# Patient Record
Sex: Female | Born: 1960 | Hispanic: Yes | Marital: Single | Smoking: Never smoker
Health system: Southern US, Community
[De-identification: ages and names within clinical notes are randomized; demographics above are authoritative.]

## PROBLEM LIST (undated history)

## (undated) DIAGNOSIS — I10 Essential (primary) hypertension: Secondary | ICD-10-CM

## (undated) DIAGNOSIS — E119 Type 2 diabetes mellitus without complications: Secondary | ICD-10-CM

## (undated) HISTORY — PX: CHOLECYSTECTOMY: SHX55

---

## 2012-06-09 ENCOUNTER — Inpatient Hospital Stay: Payer: Self-pay | Admitting: Internal Medicine

## 2012-06-09 LAB — BASIC METABOLIC PANEL
Anion Gap: 9 (ref 7–16)
BUN: 12 mg/dL (ref 7–18)
Calcium, Total: 9 mg/dL (ref 8.5–10.1)
Creatinine: 0.84 mg/dL (ref 0.60–1.30)
EGFR (Non-African Amer.): >60
Potassium: 3.2 mmol/L — ABNORMAL LOW (ref 3.5–5.1)
Sodium: 139 mmol/L (ref 136–145)

## 2012-06-09 LAB — HEPATIC FUNCTION PANEL A (ARMC)
Albumin: 4 g/dL (ref 3.4–5.0)
Alkaline Phosphatase: 191 U/L — ABNORMAL HIGH (ref 50–136)
SGOT(AST): 242 U/L — ABNORMAL HIGH (ref 15–37)
SGPT (ALT): 429 U/L — ABNORMAL HIGH (ref 12–78)
Total Protein: 7.5 g/dL (ref 6.4–8.2)

## 2012-06-09 LAB — URINALYSIS, COMPLETE
Bilirubin,UR: NEGATIVE
Glucose,UR: NEGATIVE mg/dL (ref 0–75)
Leukocyte Esterase: NEGATIVE
Nitrite: NEGATIVE
Protein: NEGATIVE
Specific Gravity: 1.014 (ref 1.003–1.030)
Squamous Epithelial: 2
WBC UR: 2 /HPF (ref 0–5)

## 2012-06-09 LAB — TROPONIN I: Troponin-I: <0.02 ng/mL

## 2012-06-09 LAB — CK TOTAL AND CKMB (NOT AT ARMC)
CK, Total: 70 U/L (ref 21–215)
CK-MB: <0.5 ng/mL — ABNORMAL LOW (ref 0.5–3.6)

## 2012-06-09 LAB — CBC
HGB: 13.7 g/dL (ref 12.0–16.0)
MCH: 29 pg (ref 26.0–34.0)
MCV: 86 fL (ref 80–100)
Platelet: 291 10*3/uL (ref 150–440)
RDW: 12.9 % (ref 11.5–14.5)

## 2012-06-09 LAB — LIPASE, BLOOD: Lipase: >3000 U/L (ref 73–393)

## 2012-06-10 LAB — CBC WITH DIFFERENTIAL/PLATELET
Eosinophil #: 0 10*3/uL (ref 0.0–0.7)
Lymphocyte #: 1.4 10*3/uL (ref 1.0–3.6)
MCHC: 33.5 g/dL (ref 32.0–36.0)
MCV: 86 fL (ref 80–100)
Monocyte #: 0.7 x10 3/mm (ref 0.2–0.9)
Monocyte %: 4.9 %
Neutrophil #: 13.2 10*3/uL — ABNORMAL HIGH (ref 1.4–6.5)
Neutrophil %: 86.1 %
Platelet: 255 10*3/uL (ref 150–440)
RDW: 12.9 % (ref 11.5–14.5)
WBC: 15.4 10*3/uL — ABNORMAL HIGH (ref 3.6–11.0)

## 2012-06-10 LAB — BASIC METABOLIC PANEL
Anion Gap: 7 (ref 7–16)
BUN: 20 mg/dL — ABNORMAL HIGH (ref 7–18)
Calcium, Total: 8.3 mg/dL — ABNORMAL LOW (ref 8.5–10.1)
Creatinine: 0.94 mg/dL (ref 0.60–1.30)
Glucose: 178 mg/dL — ABNORMAL HIGH (ref 65–99)
Osmolality: 285 (ref 275–301)
Potassium: 3.7 mmol/L (ref 3.5–5.1)

## 2012-06-10 LAB — HEPATIC FUNCTION PANEL A (ARMC)
Albumin: 3.5 g/dL (ref 3.4–5.0)
Bilirubin, Direct: 0.1 mg/dL (ref 0.00–0.20)
SGOT(AST): 113 U/L — ABNORMAL HIGH (ref 15–37)
SGPT (ALT): 325 U/L — ABNORMAL HIGH (ref 12–78)
Total Protein: 6.6 g/dL (ref 6.4–8.2)

## 2012-06-10 LAB — LIPASE, BLOOD: Lipase: >3000 U/L (ref 73–393)

## 2012-06-11 LAB — CBC WITH DIFFERENTIAL/PLATELET
Basophil #: 0 10*3/uL
Basophil %: 0.1 %
Eosinophil #: 0 10*3/uL
Eosinophil %: 0.1 %
HCT: 34.5 % — ABNORMAL LOW
HGB: 11.6 g/dL — ABNORMAL LOW
Lymphocyte %: 6.6 %
Lymphs Abs: 1 10*3/uL
MCH: 29.1 pg
MCHC: 33.6 g/dL
MCV: 87 fL
Monocyte #: 0.9 10*3/uL
Monocyte %: 5.9 %
Neutrophil #: 13.8 10*3/uL — ABNORMAL HIGH
Neutrophil %: 87.3 %
Platelet: 225 10*3/uL
RBC: 3.98 X10 6/mm 3
RDW: 12.9 %
WBC: 15.8 10*3/uL — ABNORMAL HIGH

## 2012-06-11 LAB — COMPREHENSIVE METABOLIC PANEL
Alkaline Phosphatase: 116 U/L (ref 50–136)
Anion Gap: 6 — ABNORMAL LOW (ref 7–16)
Bilirubin,Total: 0.7 mg/dL (ref 0.2–1.0)
Calcium, Total: 7.2 mg/dL — ABNORMAL LOW (ref 8.5–10.1)
Chloride: 109 mmol/L — ABNORMAL HIGH (ref 98–107)
Creatinine: 0.74 mg/dL (ref 0.60–1.30)
Glucose: 129 mg/dL — ABNORMAL HIGH (ref 65–99)
SGPT (ALT): 174 U/L — ABNORMAL HIGH (ref 12–78)
Sodium: 141 mmol/L (ref 136–145)
Total Protein: 5.8 g/dL — ABNORMAL LOW (ref 6.4–8.2)

## 2012-06-11 LAB — MAGNESIUM: Magnesium: 1.8 mg/dL

## 2012-06-11 LAB — LIPID PANEL
Cholesterol: 106 mg/dL
HDL Cholesterol: 24 mg/dL — ABNORMAL LOW
Ldl Cholesterol, Calc: 55 mg/dL
Triglycerides: 133 mg/dL
VLDL Cholesterol, Calc: 27 mg/dL

## 2012-06-11 LAB — HEMOGLOBIN A1C: Hemoglobin A1C: 6.4 % — ABNORMAL HIGH

## 2012-06-11 LAB — LIPASE, BLOOD: Lipase: 2892 U/L — ABNORMAL HIGH

## 2012-06-12 LAB — HEPATIC FUNCTION PANEL A (ARMC)
Alkaline Phosphatase: 120 U/L (ref 50–136)
Bilirubin, Direct: 0.2 mg/dL (ref 0.00–0.20)
SGOT(AST): 30 U/L (ref 15–37)
SGPT (ALT): 123 U/L — ABNORMAL HIGH (ref 12–78)
Total Protein: 6 g/dL — ABNORMAL LOW (ref 6.4–8.2)

## 2012-06-12 LAB — CBC WITH DIFFERENTIAL/PLATELET
Basophil #: 0 10*3/uL (ref 0.0–0.1)
Eosinophil %: 0.5 %
HCT: 31.9 % — ABNORMAL LOW (ref 35.0–47.0)
HGB: 10.9 g/dL — ABNORMAL LOW (ref 12.0–16.0)
Lymphocyte #: 1.2 10*3/uL (ref 1.0–3.6)
Lymphocyte %: 7.6 %
MCHC: 34.1 g/dL (ref 32.0–36.0)
MCV: 87 fL (ref 80–100)
Monocyte #: 1.1 x10 3/mm — ABNORMAL HIGH (ref 0.2–0.9)
Monocyte %: 6.6 %
Neutrophil #: 13.7 10*3/uL — ABNORMAL HIGH (ref 1.4–6.5)
Neutrophil %: 85.1 %
Platelet: 204 10*3/uL (ref 150–440)
RBC: 3.68 10*6/uL — ABNORMAL LOW (ref 3.80–5.20)
RDW: 12.9 % (ref 11.5–14.5)
WBC: 16.1 10*3/uL — ABNORMAL HIGH (ref 3.6–11.0)

## 2012-06-12 LAB — MAGNESIUM: Magnesium: 1.6 mg/dL — ABNORMAL LOW

## 2012-06-12 LAB — LIPASE, BLOOD: Lipase: 568 U/L — ABNORMAL HIGH (ref 73–393)

## 2012-06-12 LAB — POTASSIUM: Potassium: 3.8 mmol/L (ref 3.5–5.1)

## 2012-06-13 LAB — CBC WITH DIFFERENTIAL/PLATELET
HCT: 30.1 % — ABNORMAL LOW (ref 35.0–47.0)
HGB: 10.4 g/dL — ABNORMAL LOW (ref 12.0–16.0)
Lymphocyte #: 1 10*3/uL (ref 1.0–3.6)
MCH: 29.5 pg (ref 26.0–34.0)
MCHC: 34.5 g/dL (ref 32.0–36.0)
MCV: 85 fL (ref 80–100)
Monocyte #: 1 x10 3/mm — ABNORMAL HIGH (ref 0.2–0.9)
Monocyte %: 6.8 %
Neutrophil %: 85 %
Platelet: 227 10*3/uL (ref 150–440)
RBC: 3.53 10*6/uL — ABNORMAL LOW (ref 3.80–5.20)
RDW: 12.8 % (ref 11.5–14.5)

## 2012-06-13 LAB — BASIC METABOLIC PANEL
Anion Gap: 7 (ref 7–16)
Calcium, Total: 7.8 mg/dL — ABNORMAL LOW (ref 8.5–10.1)
Chloride: 100 mmol/L (ref 98–107)
Co2: 25 mmol/L (ref 21–32)
Creatinine: 0.64 mg/dL (ref 0.60–1.30)
EGFR (African American): >60
EGFR (Non-African Amer.): >60
Sodium: 132 mmol/L — ABNORMAL LOW (ref 136–145)

## 2012-06-13 LAB — LIPASE, BLOOD: Lipase: 253 U/L (ref 73–393)

## 2012-06-14 LAB — CBC WITH DIFFERENTIAL/PLATELET
Basophil #: 0 10*3/uL (ref 0.0–0.1)
Basophil %: 0.4 %
Eosinophil #: 0.3 10*3/uL (ref 0.0–0.7)
HCT: 28.3 % — ABNORMAL LOW (ref 35.0–47.0)
HGB: 9.8 g/dL — ABNORMAL LOW (ref 12.0–16.0)
Lymphocyte %: 9.4 %
MCV: 84 fL (ref 80–100)
Monocyte #: 1.2 x10 3/mm — ABNORMAL HIGH (ref 0.2–0.9)
Monocyte %: 10.1 %
RDW: 12.8 % (ref 11.5–14.5)
WBC: 12.3 10*3/uL — ABNORMAL HIGH (ref 3.6–11.0)

## 2012-06-14 LAB — BASIC METABOLIC PANEL
Anion Gap: 7 (ref 7–16)
BUN: 8 mg/dL (ref 7–18)
Calcium, Total: 7.7 mg/dL — ABNORMAL LOW (ref 8.5–10.1)
Co2: 26 mmol/L (ref 21–32)
EGFR (Non-African Amer.): >60
Glucose: 96 mg/dL (ref 65–99)
Potassium: 3.5 mmol/L (ref 3.5–5.1)

## 2012-06-14 LAB — URINE CULTURE

## 2012-06-16 LAB — PATHOLOGY REPORT

## 2012-06-18 LAB — CULTURE, BLOOD (SINGLE)

## 2014-07-13 NOTE — Consult Note (Signed)
Chief Complaint:  Subjective/Chief Complaint Pain much better. C/O some nausea.   VITAL SIGNS/ANCILLARY NOTES: **Vital Signs.:   24-Mar-14 05:34  Vital Signs Type Routine  Temperature Temperature (F) 99.1  Celsius 37.2  Pulse Pulse 86  Respirations Respirations 20  Systolic BP Systolic BP 948  Diastolic BP (mmHg) Diastolic BP (mmHg) 91  Mean BP 113  Pulse Ox % Pulse Ox % 96  Pulse Ox Activity Level  At rest  Oxygen Delivery Room Air/ 21 %   Brief Assessment:  Additional Physical Exam Abdomen is soft and fairly non tender.   Lab Results: Routine Chem:  24-Mar-14 03:52   Glucose, Serum  106  BUN 7  Creatinine (comp) 0.64  Sodium, Serum  132  Potassium, Serum  3.3  Chloride, Serum 100  CO2, Serum 25  Calcium (Total), Serum  7.8  Anion Gap 7  Osmolality (calc) 263  eGFR (African American) >60  eGFR (Non-African American) >60 (eGFR values <27m/min/1.73 m2 may be an indication of chronic kidney disease (CKD). Calculated eGFR is useful in patients with stable renal function. The eGFR calculation will not be reliable in acutely ill patients when serum creatinine is changing rapidly. It is not useful in  patients on dialysis. The eGFR calculation may not be applicable to patients at the low and high extremes of body sizes, pregnant women, and vegetarians.)  Lipase 253 (Result(s) reported on 13 Jun 2012 at 05:36AM.)  Routine Hem:  24-Mar-14 03:52   WBC (CBC)  15.2  RBC (CBC)  3.53  Hemoglobin (CBC)  10.4  Hematocrit (CBC)  30.1  Platelet Count (CBC) 227  MCV 85  MCH 29.5  MCHC 34.5  RDW 12.8  Neutrophil % 85.0  Lymphocyte % 6.9  Monocyte % 6.8  Eosinophil % 1.0  Basophil % 0.3  Neutrophil #  13.0  Lymphocyte # 1.0  Monocyte #  1.0  Eosinophil # 0.2  Basophil # 0.0 (Result(s) reported on 13 Jun 2012 at 05:34AM.)   Assessment/Plan:  Assessment/Plan:  Assessment Acute pancreatitis, much improved clinically. Lipase is normal. Low grade fever with  leucocytosis. Continued nausea. Cholelithiasis. Doubt choledocholithiasis. LFT's significantly improved.   Plan Continue current management. If fever or leucocytosis worsen, obtain CT of abdomen. Cholecystectomy per general surgery.   Electronic Signatures: IJill Side(MD)  (Signed 24-Mar-14 12:27)  Authored: Chief Complaint, VITAL SIGNS/ANCILLARY NOTES, Brief Assessment, Lab Results, Assessment/Plan   Last Updated: 24-Mar-14 12:27 by IJill Side(MD)

## 2014-07-13 NOTE — Op Note (Signed)
PATIENT NAME:  Amanda Stein, Amanda Stein MR#:  [id number] DATE OF BIRTH:  08-26-1960  DATE OF PROCEDURE:  06/14/2012  PREOPERATIVE DIAGNOSIS: Gallstone pancreatitis.   POSTOPERATIVE DIAGNOSES: Gallstone pancreatitis and chronic cholecystitis.   PROCEDURE PERFORMED: Laparoscopic cholecystectomy with cholangiogram.   SURGEON: Jaicey Sweaney A. Harmonee Tozer, M.D.   ANESTHESIA: General anesthesia.   ESTIMATED BLOOD LOSS: 50 mL.   COMPLICATIONS: None.   SPECIMEN: Gallbladder.   INDICATION FOR SURGERY: The patient is a pleasant 54 year old female, who presented with gallstone pancreatitis. Her lipase had resolved and thus she was brought to the operating room for prophylactic cholecystectomy.   DETAILS OF PROCEDURE: Informed consent was obtained. The patient was brought to the operating room suite. She was laid supine on the operating room table. She was induced. Endotracheal tube was placed, general anesthesia was administered. Her abdomen was prepped and draped in standard surgical fashion. A timeout was then performed correctly identifying the patient name, operative site and procedure to be performed. A supraumbilical incision was made. This deepened down to the fascia. Her fascia was extremely thick and even when incised required scalpel incision for her posterior sheath and the peritoneum was entered. There were n obvious adhesions. Two stay sutures were placed in the fascia. Hassan trocar was placed in the abdomen. The abdomen was insufflated. A camera was placed through the incision. The gallbladder was visualized. An 11 mm epigastric port was placed. Two 5 mm subcostal ports were placed at the midclavicular and anterior axillary line. The gallbladder was then retracted over the dome of the liver. There were a large amount of adhesions to the gallbladder and the cystic artery and cystic duct were dilated out. The cystic duct was noted to be quite dilated. A cholangiogram was then performed. Initially  there was trouble inserting the cholangiogram catheter, but by gently milking the cystic duct I was able to get a large stone to be removed and the cholangiogram catheter was then placed.   The cholangiogram was then performed, in which the proximal and distal ducts flowed. There was easy flow into the duodenum. I did not see any obvious filling defects. The cystic duct was thick and I thought that clipping it with its length would put it at risk for stump pull out, so therefore I used an endoscopic stapler to ligate the duct. I then clipped the cystic artery and then took the gallbladder off the liver bed. The gallbladder was then taken out with an Endo Catch bag. There wee multiple large stones noted. Due to the difficulty in dissecting out the cystic duct and the increased size of the duct, I elected to put a 19-French JP drain through the lateral most trocar site, which was sutured in place with a 3-0 nylon suture. The liver bed was examined for hemostasis. Electrocautery was used to provide hemostasis. I then looked at the previously placed clips and staples and those appeared to be hemostatic. When everything was hemostatic, I pulled out all trocars under direct visualization. The supraumbilical fascia was closed with a 0 Vicryl figure-of-eight suture. The skin sites of all incisions were closed using interrupted 4-0 Monocryl. Steri-Strips were then placed over the incisions and then a Telfa gauze and Tegaderm were placed. The patient was then awoken, extubated, and brought to the postanesthesia care unit. There were no immediate complications. Needle, sponge, and instrument counts were correct at the end of the procedure.   ____________________________ Si Raiderhristopher A. Brailee Riede, MD cal:aw D: 06/15/2012 07:16:21 ET T: 06/15/2012 07:45:41 ET JOB#: [id number]  cc: Cristal Deer A. Marijayne Rauth, MD, <Dictator> Jarvis Newcomer MD ELECTRONICALLY SIGNED 06/19/2012 18:40

## 2014-07-13 NOTE — Consult Note (Signed)
Chief Complaint:  Subjective/Chief Complaint S/P chole with IOC which was unremarkable. Complaining of some soreness at the site of the drain. Drain without bile.   VITAL SIGNS/ANCILLARY NOTES: **Vital Signs.:   26-Mar-14 10:31  Temperature Temperature (F) 98.3  Celsius 36.8  Pulse Pulse 62  Respirations Respirations 20  Systolic BP Systolic BP 137  Diastolic BP (mmHg) Diastolic BP (mmHg) 82  Mean BP 100  Pulse Ox % Pulse Ox % 98  Pulse Ox Activity Level  At rest   Assessment/Plan:  Assessment/Plan:  Assessment Gallstone pancreatitis, resolved. Cholelithiasis with cholecystitis s/p cholecystectomy. No evidence of CBD stone.   Plan No further GI recommendations. Further plan per general surgery. Will sign off. Thanks.   Electronic Signatures: Lurline DelIftikhar, Rya Rausch (MD)  (Signed 26-Mar-14 10:59)  Authored: Chief Complaint, VITAL SIGNS/ANCILLARY NOTES, Assessment/Plan   Last Updated: 26-Mar-14 10:59 by Lurline DelIftikhar, Vlad Mayberry (MD)

## 2014-07-13 NOTE — Discharge Summary (Signed)
Stein NAME:  Amanda Stein, Amanda Stein MR#:  [id number] DATE OF BIRTH:  Jul 24, 1960  DATE OF ADMISSION:  07/05/12 DATE OF DISCHARGE:  06/16/2012  ADMITTING DIAGNOSIS: Gallstone pancreatitis.   DISCHARGE DIAGNOSES:  1.  Acute gallstone pancreatitis.  2.  Gallstones, status post laparoscopic cholecystectomy on 06/14/2012 by Dr. Juliann Pulse.  3.  Elevated transaminases due to pancreatitis, resolving.  4.  Hypokalemia.  5.  Diabetes mellitus with hemoglobin A1c of 6.4. 6.  History of hypertension.   DISCHARGE CONDITION: Stable.   DISCHARGE MEDICATIONS: Amanda Stein is to continue:  1.  Metformin extended-release 500 mg 1 tablet twice a day. 2.  Hydrochlorothiazide/lisinopril 12.5/10 mg 1 tablet once daily. 3.  Acetaminophen/oxycodone 325/5 mg 1 tablet every 6 hours as needed.  4.  Docusate sodium 100 mg p.o. twice daily.  5.  Simethicone 80 mg p.o. every 6 hours as needed.  INSTRUCTIONS: Amanda Stein is to keep her dressings clean and dry. Keep bandage in place unless it becomes loose or soiled. JP drain care as per recommendations.   HOME OXYGEN: None.   DIET: Two gram salt, low fat, low cholesterol, carbohydrate-controlled diet, regular consistency.   ACTIVITY LIMITATIONS: As tolerated.   FOLLOWUP APPOINTMENTS: With primary care physician at [facility] in 2 days after discharge and Dr. Juliann Pulse in 1 week after discharge.   CONSULTANTS: Dr. Niel Hummer, Dr. Juliann Pulse, Dr. Darrold Junker.   RADIOLOGIC STUDIES: Chest PA and lateral on July 05, 2012 showed atelectasis at lung bases, no acute abnormality. Cholangiogram postoperatively on 06/14/2012 showed intraoperative cholangiogram, no evidence of filling defects or contrast extravasation. Cystic duct remnant was identified. MRCP on 06/11/2012 revealed pancreatitis, mild ascites, cholelithiasis, no gallbladder distention, mild biliary ductal distention as described above Ultrasound of abdomen, limited survey 2012-07-05, revealed cholelithiasis  without sonographic evidence of acute cholecystitis. Amanda common bile duct was mildly dilated. No choledocholithiasis. Hepatic steatosis was also noted.  HISTORY OF PRESENT ILLNESS: Amanda Stein is a 54 year old Spanish female with past medical history significant for history of diabetes, history of hypertension, who presented to [facility] with complaints of abdominal pains. Please refer to Dr. Serita Grit Patel's admission note on 2012/07/05. On arrival to [facility] hospital, Amanda Stein's temperature was 98.6, pulse was 58, respiration rate was 20, blood pressure 132/68, saturation 93% on room air. Physical exam was unremarkable. Amanda Stein did have right upper quadrant tenderness, but no guarding or rebound were noted. No hepatosplenomegaly was also noted. Amanda Stein's lab data done on Amanda day of admission, 2012/07/05, revealed elevated glucose level of 130. Potassium level was low at 3.2. Otherwise, BMP was unremarkable. Amanda Stein's lipase level was more than 3000. Liver enzymes showed elevated AST to 242, ALT 429; total bilirubin was 0.9 and direct bilirubin was 0.5. Her cardiac enzymes x 3 were unremarkable. White blood cell count was normal at 9.7, hemoglobin was 13.7, platelet count was 291. Urinalysis was unremarkable. EKG showed sinus brady at 67 beats per minute. No acute ST-T changes were noted. Chest x-ray was normal. Amanda Stein was admitted to [facility]. She underwent ultrasound of her abdomen on 05-Jul-2012 which showed no evidence of cholecystitis; however, cholelithiasis was noted and common bile duct was mildly dilated. Because of that as well as mild elevation of total bilirubin and elevated liver enzymes, Amanda Stein underwent also MRCP on 06/11/2012. She was consulted by gastroenterologist as well as surgeon, who followed Amanda Stein along. MRCP was negative for biliary tract obstruction and Amanda Stein was treated for acute pancreatitis conservatively. With therapy, Amanda Stein's condition  improved. Amanda Stein was ready to undergo laparoscopic cholecystectomy on 06/14/2012. Amanda Stein did well during operation. She underwent  laparoscopic cholecystectomy with cholangiogram. No obvious obstruction on cholangiogram was noted. There was easy flow into duodenum. Estimated blood loss was only 50 mL and Amanda Stein recovered from this procedure quite well. On Amanda day of discharge, 06/16/2012, she still complained of some mild discomfort in Amanda right upper quadrant, mostly at incision site area. She also had a JP drain, which she is to continue to care for until she is seen by Dr. Juliann PulseLundquist in 1 week after discharge.   In regards to elevated transaminases postoperatively, Amanda Stein's transaminases improved. As mentioned above, Amanda Stein's AST as well as ALT were elevated on Amanda day of admission. However, already on 06/12/2012, Amanda Stein's AST returned back to normal at 30 and ALT also improved to 123. It was felt that Amanda Stein's improvement is related to resolution of inflammation, possibly in gallbladder. Amanda Stein's white blood cell count was found to be 12.3 on 06/15/2012, down from 15.8 on 06/11/2012. It is recommended to follow Amanda Stein's white blood cell count as well as liver enzymes to make sure that they are normalizing completely. Amanda Stein was noted to be hypokalemic. She was supplemented with potassium IV as well as p.o. and her potassium level normalized.   In regards to her chronic medical problems such as diabetes mellitus as well as hypertension, Amanda Stein is to continue her outpatient management. She is being discharged in stable condition with Amanda above-mentioned medications and followup.   Her vital signs on Amanda day of discharge: Temperature 97.5, pulse was 75, respiration rate was 18, blood pressure 120/82, saturation was 95% to 96% on room air at rest as well as on exertion.   TIME SPENT: 40 minutes.    ____________________________ Katharina Caperima Yentl Verge,  MD rv:jm D: 06/16/2012 16:55:43 ET T: 06/16/2012 20:54:09 ET JOB#: [id number]  cc: Katharina Caperima Siomara Burkel, MD, <Dictator> [facility] Ladavion Savitz MD ELECTRONICALLY SIGNED 06/23/2012 15:22

## 2014-07-13 NOTE — Consult Note (Signed)
PATIENT NAME:  Margrett RudFERNANDEZ MEJIA, Ilka MR#:  [id number] DATE OF BIRTH:  Aug 14, 1960  DATE OF CONSULTATION:  06/11/2012  REFERRING PHYSICIAN:   CONSULTING PHYSICIAN:  Lurline DelShaukat Kerith Sherley, MD  REASON FOR CONSULTATION:  Gallstone pancreatitis.   HISTORY OF PRESENT ILLNESS:  This is a 54 year old Spanish-speaking female with a history of diabetes, hypertension. The patient has been having episodic abdominal pain mainly in the epigastric region on and off for about 6 months. For the last 3 to 4 days, the pain is severe and more constant. She came to the Emergency Room. Serum lipase was more than 3000. Bilirubin was normal, although AST, ALT and alkaline phosphatase were moderately elevated. Ultrasound of the abdomen showed gallstones, common bile duct slightly dilated at a little over 7 mm, and no intrahepatic biliary dilatation was noted. She denies any other significant symptoms. The patient was evaluated yesterday along with an interpreter. According to her, the pain is much better. She denies any nausea or vomiting. She has not had any bowel movement for the last 2 days.   PAST MEDICAL HISTORY:  Diabetes, hypertension,   PAST SURGICAL HISTORY:  None.   ALLERGIES:  None.    HOME MEDICATIONS:  Are not known.   SOCIAL HISTORY:  She does not smoke, does not drink.   FAMILY HISTORY:  Positive for diabetes.  REVIEW OF SYSTEMS:  Grossly negative except for what is mentioned in the History of Present Illness.   PHYSICAL EXAMINATION: GENERAL:  A fairly well built female, does not appear to be in any acute distress. Afebrile. Heart rate is in 70s and 80s, respirations about 18, blood pressure 120/74. Clinically she does not appear to be jaundiced or anemic.  NECK:  Neck veins are flat.  LUNGS:  Clear to auscultation bilaterally with fair entry and no added sounds.  CARDIOVASCULAR:  Regular rate and rhythm. No gallops or murmur.  ABDOMEN:  Showed mild epigastric as well as mild right lower quadrant  tenderness. No rebound or guarding was noted. No hepatosplenomegaly or ascites.  NEUROLOGIC:  Examination appears to be unremarkable.   LABORATORY, DIAGNOSTIC, AND RADIOLOGICAL DATA:  The patient's white cell count was 9.7 on admission, although it has jumped up to about 15,000 yesterday as well as today. Hemoglobin, hematocrit and platelet counts are normal Electrolytes are fairly unremarkable as well as BUN and creatinine. Total bilirubin has been normal since admission, alkaline phosphatase on admission was elevated at 191, is now normal at 116,AST was 242, it is down to 44, and ALT was 429 and is down to 174.   ASSESSMENT AND PLAN:  The patient with what appears to be gallstone pancreatitis. She has multiple stones in the gallbladder. No signs of acute cholecystitis. She clinically seems to be doing better with improvement in her abdominal pain, although her white cell count has been elevated. Liver enzymes are trending down and bilirubin remains normal. Choledocholithiasis is a possibility although biliary obstruction does not appear to be the case at this point. Offloading common bile duct stone would be most likely versus a stone that has passed. An M.R.C.P. was requested and the patient is currently undergoing an M.R.C.P. We will follow the results of the M.R.C.P. and if indicated, an ERCP will be performed. Continue Zosyn. Further recommendations to follow.     ____________________________ Lurline DelShaukat Doristine Shehan, MD si:jm D: 06/11/2012 08:52:50 ET T: 06/11/2012 11:10:26 ET JOB#: [id number]  cc: Lurline DelShaukat Jacari Kirsten, MD, <Dictator> Lurline DelSHAUKAT Ireanna Finlayson MD ELECTRONICALLY SIGNED 06/11/2012 11:59

## 2014-07-13 NOTE — Consult Note (Signed)
Brief Consult Note: Diagnosis: Pancreatitis. ? Choledocholithiasis.   Patient was seen by consultant.   Comments: Pancreatitis, clinically better. Cholelithiasis. ? Choledocholithiasis. Bilirubin is normal although LFt's are somewhat elevated. CBd is somewhat dilated as well.  Recommendations: MRCP. Probable ERCP depending on MRCP results. Will follow.  Electronic Signatures: Lurline DelIftikhar, Romney Compean (MD)  (Signed 21-Mar-14 17:13)  Authored: Brief Consult Note   Last Updated: 21-Mar-14 17:13 by Lurline DelIftikhar, Georgianna Band (MD)

## 2014-07-13 NOTE — Consult Note (Signed)
Chief Complaint:  Subjective/Chief Complaint Feels better with minimal pain.   VITAL SIGNS/ANCILLARY NOTES: **Vital Signs.:   23-Mar-14 14:53  Vital Signs Type Routine  Temperature Temperature (F) 98.9  Celsius 37.1  Temperature Source oral  Pulse Pulse 84  Respirations Respirations 18  Systolic BP Systolic BP 143  Diastolic BP (mmHg) Diastolic BP (mmHg) 85  Mean BP 104  Pulse Ox % Pulse Ox % 97  Oxygen Delivery Room Air/ 21 %   Brief Assessment:  Additional Physical Exam Abdomen is soft and non tender.   Lab Results: Hepatic:  23-Mar-14 03:41   Bilirubin, Total 0.8  Bilirubin, Direct 0.2 (Result(s) reported on 12 Jun 2012 at 05:18AM.)  Alkaline Phosphatase 120  SGPT (ALT)  123  SGOT (AST) 30  Total Protein, Serum  6.0  Albumin, Serum  2.6  Routine Chem:  23-Mar-14 03:41   Lipase  568 (Result(s) reported on 12 Jun 2012 at 05:13AM.)  Potassium, Serum 3.8 (Result(s) reported on 12 Jun 2012 at 05:13AM.)  Magnesium, Serum  1.6 (1.8-2.4 THERAPEUTIC RANGE: 4-7 mg/dL TOXIC: > 10 mg/dL  -----------------------)  Routine Hem:  23-Mar-14 03:41   WBC (CBC)  16.1  RBC (CBC)  3.68  Hemoglobin (CBC)  10.9  Hematocrit (CBC)  31.9  Platelet Count (CBC) 204  MCV 87  MCH 29.6  MCHC 34.1  RDW 12.9  Neutrophil % 85.1  Lymphocyte % 7.6  Monocyte % 6.6  Eosinophil % 0.5  Basophil % 0.2  Neutrophil #  13.7  Lymphocyte # 1.2  Monocyte #  1.1  Eosinophil # 0.1  Basophil # 0.0 (Result(s) reported on 12 Jun 2012 at 05:11AM.)   Assessment/Plan:  Assessment/Plan:  Assessment Acute pancreatitis, improving. Cholelithiasis. Doubt retained CBD stone. LFT's much better.   Plan Clear liquids advance as tolerated. Elective cholecystectomy with IOC if possible.   Electronic Signatures: Lurline DelIftikhar, Kean Gautreau (MD)  (Signed 23-Mar-14 15:13)  Authored: Chief Complaint, VITAL SIGNS/ANCILLARY NOTES, Brief Assessment, Lab Results, Assessment/Plan   Last Updated: 23-Mar-14 15:13 by  Lurline DelIftikhar, Kiley Solimine (MD)

## 2014-07-13 NOTE — Consult Note (Signed)
  DATE OF BIRTH:  1960-06-21  DATE OF CONSULTATION:  06/10/2012  PRIMARY CARE PHYSICIAN:  None; [place] for urgent problems.  ADMITTING PHYSICIAN: No local physician  CONSULTING PHYSICIAN:  Dr. Michela PitcherEly  CHIEF COMPLAINT:  The patient is a 54 year old woman seen in the hospital after admission from the Emergency Room with abdominal pain, nausea and vomiting. She has intermittent epigastric right upper quadrant pain for about 6 to 8 months, which sounds like biliary colic. She has had reflux-type symptoms, severe heartburn, bloating and indigestion. Over the several days prior to admission, she developed constant midepigastric/right upper quadrant pain, which did not abate. She had mild nausea and vomiting, was mildly anorexic. She did not have any chest pain or shortness of breath. Workup identified slightly elevated liver function studies, but a lipase greater than 3000. Ultrasound demonstrated multiple gallstones. She was admitted for possible biliary pancreatitis. She denies any history of hepatitis, yellow jaundice, pancreatitis, peptic ulcer disease, previous diagnosis of gallbladder disease or diverticulitis. She has had no previous abdominal surgery.  She takes no medications regularly, has no medical allergies. Only medical problems are hypertension and type 2 diabetes, non-insulin-dependent. She does not have her medications with her. She does not smoke cigarettes or drink alcohol, and works as a housewife.   FAMILY HISTORY:  Noncontributory to the current problem.   REVIEW OF SYSTEMS: Reviewed with the admission medical workup, and I agree with those findings, with no other additions.    PHYSICAL EXAMINATION: GENERAL: She is an alert, comfortable woman complaining of midepigastric/right upper quadrant pain.  VITAL SIGNS: Blood pressure is 110/70, heart rate is 82 and regular, oxygen saturation satisfactory.  HEENT: Exam is unremarkable. She has no scleral icterus. No pupillary  abnormalities. No facial deformities.  NECK:  Supple, nontender, without adenopathy. She has a midline trachea.  CHEST:  Clear, with no adventitious sounds and good pulmonary excursion.  CARDIAC:  Exam reveals no murmurs or gallops. She seems to be in normal sinus rhythm.  ABDOMEN: Reveals some mild midepigastric tenderness, right upper quadrant tenderness. No rebound. No guarding. No masses. No hernias. She has hypoactive but present bowel sounds.  EXTREMITIES:  Lower extremity exam reveals full range of motion, no edema and good distal pulses.  PSYCHIATRIC:  Exam reveals normal orientation, normal affect.   The entire interview was carried out with the assistance of the hospital interpreter.   IMPRESSION: I agree with working diagnosis of biliary pancreatitis. I discussed at length with the family that surgery does not have a role in the current acute treatment for this problem, but would be reserved to prevent further episodes of a similar nature. At the present time, I agree with your current plan of rehydration, bowel rest and antibiotics. We will be available to assist in her followup as necessary.    ____________________________ Carmie Endalph L. Ely III, MD rle:mr D: 06/10/2012 16:11:00 ET T: 06/10/2012 18:40:46 ET JOB#: [id number]  cc: Carmie Endalph L. Ely III, MD, <Dictator> [facility]  Quentin OreALPH L ELY MD ELECTRONICALLY SIGNED 06/12/2012 17:31

## 2014-07-13 NOTE — Consult Note (Signed)
Brief Consult Note: Diagnosis: CV eval prior to gallbladder surgery, low risk, no h/o MI, CVA, CHF, CKD.   Patient was seen by consultant.   Consult note dictated.   Comments: REC  Agree with current therapy, proceed with surgery, no further cardiac diagnostics.  Electronic Signatures: Marcina MillardParaschos, Evalena Fujii (MD)  (Signed 25-Mar-14 09:27)  Authored: Brief Consult Note   Last Updated: 25-Mar-14 09:27 by Marcina MillardParaschos, Rhiannon Sassaman (MD)

## 2014-07-13 NOTE — Consult Note (Signed)
PATIENT NAME:  Amanda Stein, [place] MR#:  [id number] DATE OF BIRTH:  05/05/1960  DATE OF CONSULTATION:  06/14/2012  REFERRING PHYSICIAN:   CONSULTING PHYSICIAN:  Marcina MillardAlexander Syndey Jaskolski, MD  PRIMARY CARE PHYSICIAN:  [place]  CHIEF COMPLAINT: Abdominal pain.   REASON FOR CONSULTATION: Requested for  preoperative cardiovascular evaluation prior to gallbladder surgery.   HISTORY OF PRESENT ILLNESS: The patient is a 54 year old Spanish-speaking female with history of diabetes and hypertension who presents with abdominal pain which has progressed over 6 months, now constant in nature. The patient also has been experiencing nausea and vomiting with poor p.o. intake. Admission labs were notable for elevated liver enzymes and lipase. Abdominal ultrasound revealed bile duct dilatation consistent with gallstone pancreatitis. The patient was seen by Dr. Lurline DelShaukat Iftikhar. The patient became stabilized and is referred for elective cholecystectomy. The patient denies chest pain. Troponin is negative.   PAST MEDICAL HISTORY: 1. Type 2 diabetes.  2.  Hypertension.   MEDICATIONS: Please see medication list.   SOCIAL HISTORY: The patient lives with her boyfriend. She denies tobacco abuse.   FAMILY HISTORY: No immediate family history of coronary artery disease or myocardial infarction.   REVIEW OF SYSTEMS: CONSTITUTIONAL: No fever or chills.  EYES: No blurry vision.  EARS: No hearing loss.  RESPIRATORY: No shortness of breath.  CARDIOVASCULAR: No chest pain.  GASTROENTEROLOGY: The patient has nausea, vomiting and abdominal pain.  GENITOURINARY: No dysuria or hematuria.  ENDOCRINE: No polyuria or polydipsia.  MUSCULOSKELETAL: No arthralgias or myalgias.  NEUROLOGICAL: No focal muscle weakness or numbness.  PSYCHOLOGICAL: No depression or anxiety.   PHYSICAL EXAMINATION: VITAL SIGNS: Blood pressure 135/87, pulse 80, respirations 18, temperature 98.5 and pulse ox 99%.  HEENT: Pupils equal and  reactive to light and accommodation.  NECK: Supple without thyromegaly.  LUNGS: Clear.  HEART: Normal JVP.  Normal PMI. Regular rate and rhythm. Normal S1 and S2. No appreciable gallop, murmur or rub.  ABDOMEN: Soft, mildly tender to palpation.  EXTREMITIES: No cyanosis, clubbing or edema. Pulses were intact bilaterally.  MUSCULOSKELETAL: Normal muscle tone.  NEUROLOGIC: The patient is alert and oriented x 3. Motor and sensory are both grossly intact.   IMPRESSION: This is a 54 year old female who presents with acute gallstone pancreatitis. Now scheduled for elective cholecystectomy. EKG is normal at baseline. The patient denies chest pain. The patient denies history of prior myocardial infarction, stroke, congestive heart failure or  chronic kidney disease. The patient does have a history of diabetes. The patient would be at low risk for serious cardiovascular complication during surgery.   RECOMMENDATIONS: 1.  Agree with overall current therapy.  2.  Would proceed with elective cholecystectomy as planned without further cardiac diagnostics at this time.  ____________________________ Marcina MillardAlexander Emelly Wurtz, MD ap:sb D: 06/14/2012 09:32:56 ET    T: 06/14/2012 09:53:46 ET        JOB#: [id number] cc: Marcina MillardAlexander Elman Dettman, MD, <Dictator> Marcina MillardALEXANDER Abbas Beyene MD ELECTRONICALLY SIGNED 06/28/2012 12:36

## 2014-07-13 NOTE — Consult Note (Signed)
Chief Complaint:  Subjective/Chief Complaint Feels better with less abdominal pain.   VITAL SIGNS/ANCILLARY NOTES: **Vital Signs.:   22-Mar-14 05:05  Vital Signs Type Routine  Temperature Temperature (F) 98.7  Celsius 37  Temperature Source oral  Pulse Pulse 81  Respirations Respirations 18  Systolic BP Systolic BP 664  Diastolic BP (mmHg) Diastolic BP (mmHg) 74  Mean BP 89  Pulse Ox % Pulse Ox % 94  Pulse Ox Activity Level  At rest  Oxygen Delivery Room Air/ 21 %   Brief Assessment:  Additional Physical Exam Abdomen is benign. Only mild tenderness in epigastrium.   Lab Results: Hepatic:  22-Mar-14 05:07   Bilirubin, Total 0.7  Alkaline Phosphatase 116  SGPT (ALT)  174  SGOT (AST)  44  Total Protein, Serum  5.8  Albumin, Serum  2.9  Routine Chem:  22-Mar-14 05:07   Glucose, Serum  129  BUN 12  Creatinine (comp) 0.74  Sodium, Serum 141  Potassium, Serum  3.3  Chloride, Serum  109  CO2, Serum 26  Calcium (Total), Serum  7.2  Osmolality (calc) 283  eGFR (African American) >60  eGFR (Non-African American) >60 (eGFR values <3m/min/1.73 m2 may be an indication of chronic kidney disease (CKD). Calculated eGFR is useful in patients with stable renal function. The eGFR calculation will not be reliable in acutely ill patients when serum creatinine is changing rapidly. It is not useful in  patients on dialysis. The eGFR calculation may not be applicable to patients at the low and high extremes of body sizes, pregnant women, and vegetarians.)  Anion Gap  6  Lipase  2892 (Result(s) reported on 11 Jun 2012 at 06:27AM.)  Hemoglobin A1c ([facility]  6.4 (The American Diabetes Association recommends that a primary goal of therapy should be <7% and that physicians should reevaluate the treatment regimen in patients with HbA1c values consistently >8%.)  Cholesterol, Serum 106  Triglycerides, Serum 133  HDL (INHOUSE)  24  VLDL Cholesterol Calculated 27  LDL Cholesterol  Calculated 55 (Result(s) reported on 11 Jun 2012 at 0[facility])  Routine Hem:  22-Mar-14 05:07   WBC (CBC)  15.8  RBC (CBC) 3.98  Hemoglobin (CBC)  11.6  Hematocrit (CBC)  34.5  Platelet Count (CBC) 225  MCV 87  MCH 29.1  MCHC 33.6  RDW 12.9  Neutrophil % 87.3  Lymphocyte % 6.6  Monocyte % 5.9  Eosinophil % 0.1  Basophil % 0.1  Neutrophil #  13.8  Lymphocyte # 1.0  Monocyte # 0.9  Eosinophil # 0.0  Basophil # 0.0 (Result(s) reported on 11 Jun 2012 at 05:57AM.)   Radiology Results: MRI:    22-Mar-14 10:16, MRCP MR Cholangiogram  MRCP MR Cholangiogram   REASON FOR EXAM:    Choledocholithiasis  COMMENTS:       PROCEDURE: MR  - MR CHOLANGIOGRAM  - Jun 11 2012 10:16AM     RESULT: History: Pain.    Comparison Study: Ultrasound abdomen 06/09/2012.    Findings: Standard M.R.C.P. obtained. Multiple gallstones present. Mild   prominence of the common bile duct noted with the duct measuring   approximately 7 to 8 mm. No filling defects or within the biliary ducts.   Noted however at the ampulla is mild soft tissue prominence. This could   be from mild ampullary swelling. No definite intra- ductal stone is   identified. Mild edema noted about the pancreas. Peripancreatic fluid     noted. Mild ascites noted. These finds are consistent with pancreatitis.  IMPRESSION:  Pancreatitis. Mild ascites. Cholelithiasis. No gallbladder   distention. Mild biliary ductal distention as described above.        Verified By: THOMAS E. REGISTER, M.D., MD   Assessment/Plan:  Assessment/Plan:  Assessment Gallstone pancreatitis. Cholelithiasis. ? Choledocholithiasis. MRCP is negative for CBD stones athough some ampullary edema was noted which nay be secondary to a passed stone. LFT's are improving.   Plan Will continue to observe closely. Repeat labs in am. Agree with ice chips. Will follow.   Electronic Signatures: Iftikhar, Shaukat (MD)  (Signed 22-Mar-14 11:30)  Authored: Chief  Complaint, VITAL SIGNS/ANCILLARY NOTES, Brief Assessment, Lab Results, Radiology Results, Assessment/Plan   Last Updated: 22-Mar-14 11:30 by Iftikhar, Shaukat (MD) 

## 2014-07-13 NOTE — H&P (Signed)
PATIENT NAME:  Amanda Stein, Amanda Stein MR#:  [id number] DATE OF BIRTH:  08-02-1960  DATE OF ADMISSION:  06/09/2012  PRIMARY CARE PHYSICIAN:  [place].  EMERGENCY DEPARTMENT REFERRING PHYSICIAN:  Caleen Jobs. Braud, MD  CHIEF COMPLAINT:  Abdominal pain.   HISTORY OF PRESENT ILLNESS:  The patient is a 54 year old Spanish-speaking female with history of diabetes and hypertension who reports that she has been having abdominal pain in the epigastric and right upper quadrant region for about 6 months worse with eating. She reports that for the past 4 days the pain has been constant and severe. She describes her pain as a sharp type of pain, epigastric in nature, radiating to the back as well as right upper quadrant pain. She also has had nausea and vomiting and has not been able to eat much. She tried ibuprofen that did not seem to help with the pain. She otherwise denies any chest pain and shortness of breath and has not noticed any jaundice or change in skin color. She has had some chills, but no fevers. She denies any diarrhea.   PAST MEDICAL HISTORY:  Significant for:  1.  Diabetes, type 2.  2.  Hypertension.   PAST SURGICAL HISTORY:  None.   ALLERGIES:  None.   CURRENT MEDICATIONS:  She is on medications for diabetes and high blood pressure. She does not recall the names of these.   SOCIAL HISTORY:  Does not smoke. Does not drink. No drugs.   FAMILY HISTORY:  Positive for diabetes.   REVIEW OF SYSTEMS:  CONSTITUTIONAL: Denies any fevers. No fatigue. No weakness. Complains of abdominal pain as above. No weight loss. No weight gain.  EYES: No blurred or double vision. No pain. No redness. No inflammation. No glaucoma. No cataracts.  ENT: No tinnitus. No ear pain. No hearing loss. No difficulty swallowing.  RESPIRATORY: Denies any cough, wheezing, hemoptysis. No COPD. No TB.  CARDIOVASCULAR: Denies any chest pain, orthopnea or edema. No arrhythmias.  GASTROINTESTINAL: Nausea, vomiting,  abdominal pain as above. No hematemesis. No melena. No changes in bowel habits.  GENITOURINARY: Denies any dysuria, hematuria, renal calculus or frequency.  ENDOCRINOLOGY: Denies any polyuria, nocturia, or thyroid problems. Does have diabetes.  HEMATOLOGIC/LYMPHATICS: Denies anemia, easy bruisability or bleeding.  SKIN: Denies acne, rash. No changes in mole, hair or skin.  MUSCULOSKELETAL: Denies any pain in the neck, back or shoulder.  NEUROLOGIC: Denies any numbness, CVA, TIA or seizures.  PSYCHIATRIC: No anxiety. No insomnia. No ADD. No OCD.   PHYSICAL EXAMINATION: VITAL SIGNS: Temperature 98.6, pulse 58, respirations 20, blood pressure 132/68, O2 is 93%.  GENERAL: The patient is an obese female in no acute distress.  HEENT: Head is atraumatic, normocephalic. Pupils equally round, reactive to light and accommodation. There is no conjunctival pallor. No scleral icterus. Nasal exam shows no drainage or ulceration. Oropharynx is clear.  NECK: No thyromegaly. No carotid bruits.  CARDIOVASCULAR: Regular rate and rhythm. No murmurs, rubs, clicks or gallops. PMI is not displaced.  ABDOMEN: Soft. There is right upper quadrant tenderness. No guarding, no rebound. There is no hepatosplenomegaly.  EXTREMITIES: No clubbing, cyanosis or edema.  SKIN: There is no rash.  LYMPHATICS: No lymph nodes palpable.  VASCULAR: Good DP, PT pulses.  PSYCHIATRIC: Not anxious or depressed.  NEUROLOGICAL: Awake, alert and oriented x 3.   EVALUATIONS IN THE ED:  She has an ultrasound of the abdomen which shows cholelithiasis without sonographic evidence of acute cholecystitis. The common bile duct is mildly dilated. There  is no cholelithiasis. UA shows nitrites negative, leukocytes negative. PA and lateral chest x-ray shows no atelectasis of the lung bases. WBC is 9.7, hemoglobin 13.7, platelet count 291. BMP: Glucose 130, BUN 12, creatinine 0.84, sodium 139, potassium 3.2, chloride 103, CO2 is 27, calcium 9.0. Troponin  is less than 0.02. Bilirubin total is 0.9, bilirubin direct 0.5, alkaline phosphatase 191, AST is 429, ALT is 242, lipase greater than 3000.   ASSESSMENT AND PLAN:  The patient is a 54 year old Hispanic female with abdominal pain noticed to have gallstone pancreatitis.  1.  Gallstone pancreatitis. At this time, we will keep her n.p.o. and give her intravenous fluids. We will repeat a lipase in the morning. We will get a gastroenterology evaluation. Due to stone causing her pancreatitis, she may need an ERCP. We will control her pain with Dilaudid. I will also have surgery to come see the patient. The patient will eventually need a cholecystectomy.  2.  Acute hepatitis due to gallstone pancreatitis. We will repeat liver function tests in the morning.  3.  Hypokalemia. We will place potassium in her intravenous fluids.  4.  Diabetes. She is on oral medications. At this time, she will be n.p.o. We will keep her on sliding scale insulin.  5.  History of hypertension. She is not sure what medication she is on. At this time, I will put her on p.r.n. hydralazine for a very high blood pressure.  6.  Miscellaneous. We will place her on heparin for deep vein thrombosis prophylaxis.   TIME SPENT:  35 minutes.    ____________________________ Lacie ScottsShreyang H. Allena KatzPatel, MD shp:si D: 06/09/2012 21:01:00 ET T: 06/09/2012 21:51:11 ET JOB#: [id number]  cc: Leoma Folds H. Allena KatzPatel, MD, <Dictator> Charise CarwinSHREYANG H Sana Tessmer MD ELECTRONICALLY SIGNED 06/10/2012 11:32

## 2015-03-21 ENCOUNTER — Emergency Department: Payer: Self-pay

## 2015-03-21 ENCOUNTER — Encounter: Payer: Self-pay | Admitting: *Deleted

## 2015-03-21 ENCOUNTER — Emergency Department
Admission: EM | Admit: 2015-03-21 | Discharge: 2015-03-21 | Disposition: A | Discharge status: Home / Self Care | Payer: Self-pay | Attending: Emergency Medicine | Admitting: Emergency Medicine

## 2015-03-21 DIAGNOSIS — S52252A Displaced comminuted fracture of shaft of ulna, left arm, initial encounter for closed fracture: Secondary | ICD-10-CM | POA: Insufficient documentation

## 2015-03-21 DIAGNOSIS — E119 Type 2 diabetes mellitus without complications: Secondary | ICD-10-CM | POA: Insufficient documentation

## 2015-03-21 DIAGNOSIS — Y9389 Activity, other specified: Secondary | ICD-10-CM | POA: Insufficient documentation

## 2015-03-21 DIAGNOSIS — I10 Essential (primary) hypertension: Secondary | ICD-10-CM | POA: Insufficient documentation

## 2015-03-21 DIAGNOSIS — Y998 Other external cause status: Secondary | ICD-10-CM | POA: Insufficient documentation

## 2015-03-21 DIAGNOSIS — W01198A Fall on same level from slipping, tripping and stumbling with subsequent striking against other object, initial encounter: Secondary | ICD-10-CM | POA: Insufficient documentation

## 2015-03-21 DIAGNOSIS — S52202A Unspecified fracture of shaft of left ulna, initial encounter for closed fracture: Secondary | ICD-10-CM

## 2015-03-21 DIAGNOSIS — Y9289 Other specified places as the place of occurrence of the external cause: Secondary | ICD-10-CM | POA: Insufficient documentation

## 2015-03-21 HISTORY — DX: Essential (primary) hypertension: I10

## 2015-03-21 HISTORY — DX: Type 2 diabetes mellitus without complications: E11.9

## 2015-03-21 MED ORDER — OXYCODONE-ACETAMINOPHEN 7.5-325 MG PO TABS: 1.0000 | ORAL_TABLET | Freq: Four times a day (QID) | ORAL | Status: AC | PRN

## 2015-03-21 MED ORDER — OXYCODONE-ACETAMINOPHEN 5-325 MG PO TABS
2.0000 | ORAL_TABLET | Freq: Once | ORAL | Status: AC
  Administered 2015-03-21: 2 via ORAL
  Filled 2015-03-21: qty 2

## 2015-03-21 MED ORDER — IBUPROFEN 800 MG PO TABS: 800.0000 mg | ORAL_TABLET | Freq: Three times a day (TID) | ORAL | Status: AC | PRN

## 2015-03-21 NOTE — ED Provider Notes (Signed)
[facility] Emergency Department Provider Note  ____________________________________________  Time seen: Approximately 6:34 PM  I have reviewed the triage vital signs and the nursing notes.   HISTORY  Chief Complaint Wrist Pain   Via interpreter HPI Amanda Stein is a 54 y.o. female patient complaining of pain and edema to left wrist secondary to the trip and fall today. Patient states she hit her wrist on a cement block and she fell. Patient state increased pain with any movement of the wrist. She rates the pain as 8/10. Patient describes the pain as sharp. No palliative measures taken for this complaint.   Past Medical History  Diagnosis Date  . Hypertension   . Diabetes mellitus without complication (HCC)     There are no active problems to display for this patient.   Past Surgical History  Procedure Laterality Date  . Cholecystectomy      Current Outpatient Rx  Name  Route  Sig  Dispense  Refill  . ibuprofen (ADVIL,MOTRIN) 800 MG tablet   Oral   Take 1 tablet (800 mg total) by mouth every 8 (eight) hours as needed for moderate pain.   15 tablet   0   . oxyCODONE-acetaminophen (PERCOCET) 7.5-325 MG tablet   Oral   Take 1 tablet by mouth every 6 (six) hours as needed for severe pain.   12 tablet   0     Allergies Review of patient's allergies indicates no known allergies.  No family history on file.  Social History Social History  Substance Use Topics  . Smoking status: Never Smoker   . Smokeless tobacco: None  . Alcohol Use: No    Review of Systems Constitutional: No fever/chills Eyes: No visual changes. ENT: No sore throat. Cardiovascular: Denies chest pain. Respiratory: Denies shortness of breath. Gastrointestinal: No abdominal pain.  No nausea, no vomiting.  No diarrhea.  No constipation. Genitourinary: Negative for dysuria. Musculoskeletal: Negative for back pain. Skin: Negative for rash. Neurological:  Negative for headaches, focal weakness or numbness. Endocrine:Hypertension and diabetes   ____________________________________________   PHYSICAL EXAM:  VITAL SIGNS: ED Triage Vitals  Enc Vitals Group     BP 03/21/15 1805 173/92 mmHg     Pulse Rate 03/21/15 1805 83     Resp 03/21/15 1805 20     Temp 03/21/15 1805 98.2 F (36.8 C)     Temp Source 03/21/15 1805 Oral     SpO2 03/21/15 1805 98 %     Weight 03/21/15 1805 167 lb (75.751 kg)     Height --      Head Cir --      Peak Flow --      Pain Score 03/21/15 1803 8     Pain Loc --      Pain Edu? --      Excl. in GC? --     Constitutional: Alert and oriented. Well appearing and in no acute distress. Eyes: Conjunctivae are normal. PERRL. EOMI. Head: Atraumatic. Nose: No congestion/rhinnorhea. Mouth/Throat: Mucous membranes are moist.  Oropharynx non-erythematous. Neck: No stridor.  No cervical spine tenderness to palpation. Hematological/Lymphatic/Immunilogical: No cervical lymphadenopathy. Cardiovascular: Normal rate, regular rhythm. Grossly normal heart sounds.  Good peripheral circulation. Respiratory: Normal respiratory effort.  No retractions. Lungs CTAB. Gastrointestinal: Soft and nontender. No distention. No abdominal bruits. No CVA tenderness. Musculoskeletal: No lower extremity tenderness nor edema.  No joint effusions. Neurologic:  Normal speech and language. No gross focal neurologic deficits are appreciated. No gait instability. Skin:  Skin is warm, dry and intact. No rash noted. Psychiatric: Mood and affect are normal. Speech and behavior are normal.  ____________________________________________   LABS (all labs ordered are listed, but only abnormal results are displayed)  Labs Reviewed - No data to display ____________________________________________  EKG   ____________________________________________  RADIOLOGY  Comminuted displace distal ulnar shaft fracture. I, Joni Reiningonald K Maxi Carreras, personally  viewed and evaluated these images (plain radiographs) as part of my medical decision making, as well as reviewing the written report by the radiologist.  ____________________________________________   PROCEDURES  Procedure(s) performed:   Critical Care performed: No  ____________________________________________   INITIAL IMPRESSION / ASSESSMENT AND PLAN / ED COURSE  Pertinent labs & imaging results that were available during my care of the patient were reviewed by me and considered in my medical decision making (see chart for details).  Left distal ulnar fracture. This x-ray findings with patient. Discussed x-ray findings with orthopedics. Patient we will place an OCL splint and sling and contact orthopedic clinic in the morning. Patient given a prescription for Percocets and ibuprofen. ____________________________________________   FINAL CLINICAL IMPRESSION(S) / ED DIAGNOSES  Final diagnoses:  Left ulnar fracture, closed, initial encounter      Joni ReiningRonald K Genaro Bekker, PA-C 03/21/15 2005  Phineas SemenGraydon Goodman, MD 03/26/15 [phone]

## 2015-03-21 NOTE — Discharge Instructions (Signed)
Wrist splint and sling until seen by orthopedics.

## 2015-03-21 NOTE — ED Notes (Signed)
Today tripped hitting left wrist , has pain in area

## 2015-03-21 NOTE — ED Notes (Signed)
Assessed per PA w/ interpreter

## 2015-03-27 ENCOUNTER — Encounter
Admission: RE | Admit: 2015-03-27 | Discharge: 2015-03-27 | Disposition: A | Discharge status: Home / Self Care | Payer: Self-pay | Source: Ambulatory Visit | Attending: Orthopedic Surgery | Admitting: Orthopedic Surgery

## 2015-03-27 DIAGNOSIS — Z01812 Encounter for preprocedural laboratory examination: Secondary | ICD-10-CM | POA: Insufficient documentation

## 2015-03-27 DIAGNOSIS — Z01810 Encounter for preprocedural cardiovascular examination: Secondary | ICD-10-CM | POA: Insufficient documentation

## 2015-03-27 LAB — BASIC METABOLIC PANEL
ANION GAP: 5 (ref 5–15)
BUN: 20 mg/dL (ref 6–20)
CHLORIDE: 101 mmol/L (ref 101–111)
CO2: 29 mmol/L (ref 22–32)
Calcium: 9 mg/dL (ref 8.9–10.3)
Creatinine, Ser: 0.79 mg/dL (ref 0.44–1.00)
GFR calc Af Amer: >60 mL/min (ref >60)
Glucose, Bld: 337 mg/dL — ABNORMAL HIGH (ref 65–99)
POTASSIUM: 3.8 mmol/L (ref 3.5–5.1)
SODIUM: 135 mmol/L (ref 135–145)

## 2015-03-27 LAB — CBC
HCT: 37.8 % (ref 35.0–47.0)
HEMOGLOBIN: 13 g/dL (ref 12.0–16.0)
MCH: 28.6 pg (ref 26.0–34.0)
MCHC: 34.5 g/dL (ref 32.0–36.0)
MCV: 82.7 fL (ref 80.0–100.0)
PLATELETS: 270 10*3/uL (ref 150–440)
RBC: 4.57 MIL/uL (ref 3.80–5.20)
RDW: 12.6 % (ref 11.5–14.5)
WBC: 8.1 10*3/uL (ref 3.6–11.0)

## 2015-03-27 NOTE — Pre-Procedure Instructions (Addendum)
SPOKE WITH DR Karlton LemonKARENZ RE GLUCOSE 337 AND PATIENT HAD NOT TAKEN METFORMIN TODAY. WILL RECHECK IN AM. INFO FAXED TO DR MENZ. SPOKE WITH HOPE

## 2015-03-27 NOTE — Pre-Procedure Instructions (Signed)
SPOKE WITH DR Karlton LemonKARENZ ABOUT EKG AND OK TO PROCEED

## 2015-03-28 ENCOUNTER — Encounter
Admission: RE | Disposition: A | Discharge status: Home / Self Care | Payer: Self-pay | Source: Ambulatory Visit | Attending: Orthopedic Surgery

## 2015-03-28 ENCOUNTER — Encounter: Payer: Self-pay | Admitting: *Deleted

## 2015-03-28 ENCOUNTER — Ambulatory Visit: Payer: Self-pay | Admitting: Anesthesiology

## 2015-03-28 ENCOUNTER — Ambulatory Visit: Payer: Self-pay

## 2015-03-28 ENCOUNTER — Ambulatory Visit
Admission: RE | Admit: 2015-03-28 | Discharge: 2015-03-28 | Disposition: A | Discharge status: Home / Self Care | Payer: Self-pay | Source: Ambulatory Visit | Attending: Orthopedic Surgery | Admitting: Orthopedic Surgery

## 2015-03-28 DIAGNOSIS — Z833 Family history of diabetes mellitus: Secondary | ICD-10-CM | POA: Insufficient documentation

## 2015-03-28 DIAGNOSIS — E119 Type 2 diabetes mellitus without complications: Secondary | ICD-10-CM | POA: Insufficient documentation

## 2015-03-28 DIAGNOSIS — Z8781 Personal history of (healed) traumatic fracture: Secondary | ICD-10-CM

## 2015-03-28 DIAGNOSIS — Z791 Long term (current) use of non-steroidal anti-inflammatories (NSAID): Secondary | ICD-10-CM | POA: Insufficient documentation

## 2015-03-28 DIAGNOSIS — Z79891 Long term (current) use of opiate analgesic: Secondary | ICD-10-CM | POA: Insufficient documentation

## 2015-03-28 DIAGNOSIS — Z9049 Acquired absence of other specified parts of digestive tract: Secondary | ICD-10-CM | POA: Insufficient documentation

## 2015-03-28 DIAGNOSIS — Z9889 Other specified postprocedural states: Secondary | ICD-10-CM | POA: Insufficient documentation

## 2015-03-28 DIAGNOSIS — I10 Essential (primary) hypertension: Secondary | ICD-10-CM | POA: Insufficient documentation

## 2015-03-28 DIAGNOSIS — S52222A Displaced transverse fracture of shaft of left ulna, initial encounter for closed fracture: Secondary | ICD-10-CM | POA: Insufficient documentation

## 2015-03-28 DIAGNOSIS — Z79899 Other long term (current) drug therapy: Secondary | ICD-10-CM | POA: Insufficient documentation

## 2015-03-28 DIAGNOSIS — W1809XA Striking against other object with subsequent fall, initial encounter: Secondary | ICD-10-CM | POA: Insufficient documentation

## 2015-03-28 HISTORY — PX: ORIF ULNAR FRACTURE: SHX5417

## 2015-03-28 LAB — GLUCOSE, CAPILLARY
Glucose-Capillary: 215 mg/dL — ABNORMAL HIGH (ref 65–99)
Glucose-Capillary: 160 mg/dL — ABNORMAL HIGH (ref 65–99)

## 2015-03-28 SURGERY — OPEN REDUCTION INTERNAL FIXATION (ORIF) ULNAR FRACTURE
Anesthesia: General | Laterality: Left | Wound class: Clean

## 2015-03-28 MED ORDER — LIDOCAINE HCL (CARDIAC) 20 MG/ML IV SOLN
INTRAVENOUS | Status: DC | PRN
  Administered 2015-03-28: 30 mg via INTRAVENOUS

## 2015-03-28 MED ORDER — ONDANSETRON HCL 4 MG/2ML IJ SOLN
INTRAMUSCULAR | Status: DC | PRN
  Administered 2015-03-28: 4 mg via INTRAVENOUS

## 2015-03-28 MED ORDER — FAMOTIDINE 20 MG PO TABS
ORAL_TABLET | ORAL | Status: AC
  Administered 2015-03-28: 20 mg via ORAL
  Filled 2015-03-28: qty 1

## 2015-03-28 MED ORDER — HYDRALAZINE HCL 20 MG/ML IJ SOLN
INTRAMUSCULAR | Status: AC
Start: 2015-03-28 — End: 2015-03-28
  Administered 2015-03-28: 10 mg via INTRAVENOUS
  Filled 2015-03-28: qty 1

## 2015-03-28 MED ORDER — FENTANYL CITRATE (PF) 100 MCG/2ML IJ SOLN
25.0000 ug | INTRAMUSCULAR | Status: DC | PRN
  Administered 2015-03-28 (×4): 25 ug via INTRAVENOUS

## 2015-03-28 MED ORDER — NEOMYCIN-POLYMYXIN B GU 40-200000 IR SOLN
Status: DC | PRN
  Administered 2015-03-28: 2 mL

## 2015-03-28 MED ORDER — HYDROMORPHONE HCL 1 MG/ML IJ SOLN
0.2500 mg | INTRAMUSCULAR | Status: DC | PRN
  Administered 2015-03-28 (×2): 0.25 mg via INTRAVENOUS
  Administered 2015-03-28: 0.5 mg via INTRAVENOUS

## 2015-03-28 MED ORDER — DEXAMETHASONE SODIUM PHOSPHATE 10 MG/ML IJ SOLN
INTRAMUSCULAR | Status: DC | PRN
  Administered 2015-03-28: 4 mg via INTRAVENOUS

## 2015-03-28 MED ORDER — KETOROLAC TROMETHAMINE 30 MG/ML IJ SOLN
INTRAMUSCULAR | Status: DC | PRN
  Administered 2015-03-28: 30 mg via INTRAVENOUS

## 2015-03-28 MED ORDER — HYDRALAZINE HCL 20 MG/ML IJ SOLN
10.0000 mg | Freq: Once | INTRAMUSCULAR | Status: AC
  Administered 2015-03-28: 10 mg via INTRAVENOUS

## 2015-03-28 MED ORDER — CEFAZOLIN SODIUM-DEXTROSE 2-3 GM-% IV SOLR: 2.0000 g | INTRAVENOUS | Status: DC

## 2015-03-28 MED ORDER — HYDROMORPHONE HCL 1 MG/ML IJ SOLN
INTRAMUSCULAR | Status: AC
  Administered 2015-03-28: 0.5 mg via INTRAVENOUS
  Filled 2015-03-28: qty 1

## 2015-03-28 MED ORDER — LACTATED RINGERS IV SOLN
INTRAVENOUS | Status: DC | PRN
  Administered 2015-03-28 (×2): via INTRAVENOUS

## 2015-03-28 MED ORDER — ONDANSETRON HCL 4 MG/2ML IJ SOLN
4.0000 mg | Freq: Once | INTRAMUSCULAR | Status: AC | PRN
  Administered 2015-03-28: 4 mg via INTRAVENOUS

## 2015-03-28 MED ORDER — HYDROCODONE-ACETAMINOPHEN 5-325 MG PO TABS
1.0000 | ORAL_TABLET | Freq: Four times a day (QID) | ORAL | Status: DC | PRN
  Administered 2015-03-28: 1 via ORAL

## 2015-03-28 MED ORDER — NEOMYCIN-POLYMYXIN B GU 40-200000 IR SOLN
Status: AC
  Filled 2015-03-28: qty 2

## 2015-03-28 MED ORDER — FAMOTIDINE 20 MG PO TABS
20.0000 mg | ORAL_TABLET | Freq: Once | ORAL | Status: AC
  Administered 2015-03-28: 20 mg via ORAL

## 2015-03-28 MED ORDER — HYDROCODONE-ACETAMINOPHEN 5-325 MG PO TABS: 1.0000 | ORAL_TABLET | Freq: Four times a day (QID) | ORAL | Status: AC | PRN

## 2015-03-28 MED ORDER — HYDROCODONE-ACETAMINOPHEN 5-325 MG PO TABS
ORAL_TABLET | ORAL | Status: AC
  Filled 2015-03-28: qty 1

## 2015-03-28 MED ORDER — CHLORHEXIDINE GLUCONATE 4 % EX LIQD: 60.0000 mL | Freq: Once | CUTANEOUS | Status: DC

## 2015-03-28 MED ORDER — FENTANYL CITRATE (PF) 100 MCG/2ML IJ SOLN
INTRAMUSCULAR | Status: DC | PRN
  Administered 2015-03-28: 25 ug via INTRAVENOUS
  Administered 2015-03-28: 100 ug via INTRAVENOUS
  Administered 2015-03-28: 50 ug via INTRAVENOUS
  Administered 2015-03-28: 25 ug via INTRAVENOUS

## 2015-03-28 MED ORDER — PROPOFOL 10 MG/ML IV BOLUS
INTRAVENOUS | Status: DC | PRN
  Administered 2015-03-28: 150 mg via INTRAVENOUS

## 2015-03-28 MED ORDER — SODIUM CHLORIDE 0.9 % IV SOLN
INTRAVENOUS | Status: DC
  Administered 2015-03-28: 13:00:00 via INTRAVENOUS

## 2015-03-28 MED ORDER — ONDANSETRON HCL 4 MG/2ML IJ SOLN
INTRAMUSCULAR | Status: AC
Start: 2015-03-28 — End: 2015-03-28
  Administered 2015-03-28: 4 mg via INTRAVENOUS
  Filled 2015-03-28: qty 2

## 2015-03-28 MED ORDER — FENTANYL CITRATE (PF) 100 MCG/2ML IJ SOLN
INTRAMUSCULAR | Status: AC
  Administered 2015-03-28: 25 ug via INTRAVENOUS
  Filled 2015-03-28: qty 2

## 2015-03-28 MED ORDER — MIDAZOLAM HCL 2 MG/2ML IJ SOLN
INTRAMUSCULAR | Status: DC | PRN
  Administered 2015-03-28: 2 mg via INTRAVENOUS

## 2015-03-28 MED ORDER — CEFAZOLIN SODIUM-DEXTROSE 2-3 GM-% IV SOLR
INTRAVENOUS | Status: AC
  Administered 2015-03-28: 2 g via INTRAVENOUS
  Filled 2015-03-28: qty 50

## 2015-03-28 SURGICAL SUPPLY — 40 items
BANDAGE ELASTIC 4 CLIP ST LF (GAUZE/BANDAGES/DRESSINGS) ×3 IMPLANT
BIT DRILL 2.2 SS TIBIAL (BIT) ×3 IMPLANT
CANISTER SUCT 1200ML W/VALVE (MISCELLANEOUS) ×3 IMPLANT
CHLORAPREP W/TINT 26ML (MISCELLANEOUS) ×3 IMPLANT
DRAPE FLUOR MINI C-ARM 54X84 (DRAPES) ×3 IMPLANT
DVR ULNA PLATE ×3 IMPLANT
GAUZE PETRO XEROFOAM 1X8 (MISCELLANEOUS) ×3 IMPLANT
GAUZE SPONGE 4X4 12PLY STRL (GAUZE/BANDAGES/DRESSINGS) ×3 IMPLANT
GLOVE BIOGEL PI IND STRL 9 (GLOVE) ×1 IMPLANT
GLOVE BIOGEL PI INDICATOR 9 (GLOVE) ×2
GLOVE SURG ORTHO 9.0 STRL STRW (GLOVE) ×3 IMPLANT
GOWN SPECIALTY ULTRA XL (MISCELLANEOUS) ×3 IMPLANT
GOWN STRL REUS W/ TWL LRG LVL3 (GOWN DISPOSABLE) ×1 IMPLANT
GOWN STRL REUS W/TWL LRG LVL3 (GOWN DISPOSABLE) ×2
K-WIRE 1.6 (WIRE) ×4
K-WIRE FX5X1.6XNS BN SS (WIRE) ×2
KIT RM TURNOVER STRD PROC AR (KITS) ×3 IMPLANT
KWIRE FX5X1.6XNS BN SS (WIRE) ×2 IMPLANT
NEEDLE FILTER BLUNT 18X 1/2SAF (NEEDLE) ×2
NEEDLE FILTER BLUNT 18X1 1/2 (NEEDLE) ×1 IMPLANT
NS IRRIG 500ML POUR BTL (IV SOLUTION) ×3 IMPLANT
PACK EXTREMITY ARMC (MISCELLANEOUS) ×3 IMPLANT
PAD CAST CTTN 4X4 STRL (SOFTGOODS) ×2 IMPLANT
PAD GROUND ADULT SPLIT (MISCELLANEOUS) ×3 IMPLANT
PADDING CAST COTTON 4X4 STRL (SOFTGOODS) ×4
SCREW  LP NL 2.7X15MM (Screw) ×2 IMPLANT
SCREW LOCK 12X2.7X 3 LD (Screw) ×1 IMPLANT
SCREW LOCK 14X2.7X 3 LD TPR (Screw) ×1 IMPLANT
SCREW LOCKING 2.7X12MM (Screw) ×2 IMPLANT
SCREW LOCKING 2.7X13MM (Screw) ×3 IMPLANT
SCREW LOCKING 2.7X14 (Screw) ×2 IMPLANT
SCREW LOCKING 2.7X15MM (Screw) ×3 IMPLANT
SCREW LP NL 2.7X15MM (Screw) ×1 IMPLANT
SPLINT CAST 1 STEP 3X12 (MISCELLANEOUS) ×3 IMPLANT
STOCKINETTE STRL 4IN 9604848 (GAUZE/BANDAGES/DRESSINGS) ×3 IMPLANT
SUT ETHILON 4-0 (SUTURE) ×2
SUT ETHILON 4-0 FS2 18XMFL BLK (SUTURE) ×1
SUT VICRYL 3-0 27IN (SUTURE) ×3 IMPLANT
SUTURE ETHLN 4-0 FS2 18XMF BLK (SUTURE) ×1 IMPLANT
SYR 3ML LL SCALE MARK (SYRINGE) ×3 IMPLANT

## 2015-03-28 NOTE — Progress Notes (Signed)
Interpreter at bedside for discharge instructions , pt. And daughter state understanding of instructions

## 2015-03-28 NOTE — Anesthesia Preprocedure Evaluation (Signed)
Anesthesia Evaluation  Patient identified by MRN, date of birth, ID band Patient awake    Reviewed: Allergy & Precautions, NPO status , Patient's Chart, lab work & pertinent test results  History of Anesthesia Complications Negative for: history of anesthetic complications  Airway Mallampati: II       Dental  (+) Loose   Pulmonary neg pulmonary ROS,    Pulmonary exam normal        Cardiovascular Exercise Tolerance: Good hypertension, Pt. on medications  Rhythm:Regular     Neuro/Psych    GI/Hepatic negative GI ROS, Neg liver ROS,   Endo/Other  diabetes, Well Controlled, Type 2, Oral Hypoglycemic Agents  Renal/GU      Musculoskeletal   Abdominal Normal abdominal exam  (+)   Peds negative pediatric ROS (+)  Hematology negative hematology ROS (+)   Anesthesia Other Findings   Reproductive/Obstetrics                             Anesthesia Physical Anesthesia Plan  ASA: II  Anesthesia Plan: General   Post-op Pain Management:    Induction: Intravenous  Airway Management Planned: LMA  Additional Equipment:   Intra-op Plan:   Post-operative Plan: Extubation in OR  Informed Consent:   Plan Discussed with: CRNA  Anesthesia Plan Comments:         Anesthesia Quick Evaluation

## 2015-03-28 NOTE — Progress Notes (Signed)
Hydralazine given for blood pressure 196/107

## 2015-03-28 NOTE — Progress Notes (Signed)
zofran given for vomitting approx 100 clear liquid  Another dose of hydralazine given for blood pressure

## 2015-03-28 NOTE — H&P (Signed)
Reviewed paper H+P, will be scanned into chart. No changes noted.  

## 2015-03-28 NOTE — Progress Notes (Signed)
Elevated hand on pillows and applied ice

## 2015-03-28 NOTE — Op Note (Signed)
03/28/2015  4:14 PM  PATIENT:  Amanda Stein  55 y.o. female  PRE-OPERATIVE DIAGNOSIS:  LEFT WRIST FRACTURE distal ulna displaced  POST-OPERATIVE DIAGNOSIS:  LEFT WRIST FRACTURE distal ulna displaced  PROCEDURE:  Procedure(s): OPEN REDUCTION INTERNAL FIXATION (ORIF) ULNAR FRACTURE (Left)  SURGEON: Leitha SchullerMichael J Kedar Sedano, MD  ASSISTANTS: None  ANESTHESIA:   general  EBL:     BLOOD ADMINISTERED:none  DRAINS: none   LOCAL MEDICATIONS USED:  NONE  SPECIMEN:  No Specimen  DISPOSITION OF SPECIMEN:  N/A  COUNTS:  YES  TOURNIQUET:   34 minutes at 250 mmHg  IMPLANTS: Biomet cross lock distal ulna plate and screws  DICTATION: .Dragon Dictation patient brought the operating room and after adequate general anesthesia was obtained the left arm was prepped and draped in sterile fashion. After patient identification and timeout procedures were completed, the distal ulna was approached through a ulnar sided approach was centered over the fracture site. The skin and subcutaneous tissues tissue was incised and the fracture site exposed there is periosteum flipped into the space and the distal fragment was displaced dorsally. This was reduced with use of a bone clamps and then a K wire inserted down the canal to hold position. A ulnar plate from the Biomet cross lock system was placed and a K wire used to fix it to the distal fragment. 2 distal locking screws were placed and then the nonlocking shaft screws placed through the slotted hole in this brought the plate down to the shaft and open get near anatomical reduction. The 2 most proximal locking screw holes were then filled drilling measuring and placing the locking screws. The oblique screw distally would've cross the fracture site and was not felt that it would be helpful so this was left open with the fast guides removed. With the multiple screws placed and all K wires removed there is full pronation and supination present is no catching or  grinding did not appear to be a hardware in the DRUJ. Alignment was near anatomic under the mini C-arm. The tourniquet was let down at this point with the wound thoroughly irrigated the wound was closed with the 2-0 Vicryl and 4-0 nylon for the skin. Xeroform 4 x 4's web roll and a volar splint were applied followed by an Ace wrap.  PLAN OF CARE: Discharge to home after PACU  PATIENT DISPOSITION:  PACU - hemodynamically stable.

## 2015-03-28 NOTE — Discharge Instructions (Addendum)
Keep arm elevated. Work fingers is much as possible. Keep splint clean and dryAMBULATORY SURGERY       DISCHARGE INSTRUCTIONS   1) The drugs that you were given will stay in your system until tomorrow so for the next 24 hours you should not:  A) Drive an automobile B) Make any legal decisions C) Drink any alcoholic beverage   2) You may resume regular meals tomorrow.  Today it is better to start with liquids and gradually work up to solid foods.  You may eat anything you prefer, but it is better to start with liquids, then soup and crackers, and gradually work up to solid foods.   3) Please notify your doctor immediately if you have any unusual bleeding, trouble breathing, redness and pain at the surgery site, drainage, fever, or pain not relieved by medication. 4)   5) Your post-operative visit with Dr.                                     is: Date:                        Time:    Please call to schedule your post-operative visit.  6) Additional Instructions:      CIRUGIA AMBULATORIA       Instruccionnes de alta    Date (Fecha)    1.  Las drogas que se Dispensing opticianle administraron permaneceran en su cuerpo [employer]asta [place], asi      que por las proximas 24 horas usted no debe:   Conducir Field seismologist(manejar) un automovil   Hacer ninguna decision legal   Tomar ninguna bebida alcoholica  2.  A) [place] puede comenzar una dieta regular.  Es mejor que hoy empiece con                    liquidos y gradualmente anada [address].       B) Puede comer cualquier comida que desee pero es mejor empezar con liquidos,               luego sopitas con galletas saladas y gradualmente llegar a las comidas solidas.  3.  Por favor avise a su medico inmediatamente si usted tiene algun sangrado anormal,       tiene dificultad con la respiracion, enrojecimiento y Engineer, miningdolor en el sitio de la cirugia,     Susan Mooredrenaje, fiebro o dolor que se alivia con [place]medicina.  4.  A) Su visita posoperatoria (despues de su operacion)  es con el  Dr.   Date                     Time         B)  Por favor llame para hacer la cita posoperatoria.  5.  Istrucciones especificas :

## 2015-03-28 NOTE — Transfer of Care (Signed)
Immediate Anesthesia Transfer of Care Note  Patient: Amanda Stein  Procedure(s) Performed: Procedure(s): OPEN REDUCTION INTERNAL FIXATION (ORIF) ULNAR FRACTURE (Left)  Patient Location: PACU  Anesthesia Type:General  Level of Consciousness: sedated  Airway & Oxygen Therapy: Patient Spontanous Breathing and Patient connected to face mask oxygen  Post-op Assessment: Report given to RN  Post vital signs: Reviewed and stable; Dr Noralyn Pickarroll notified of hypertension  Last Vitals:  Filed Vitals:   03/28/15 1202 03/28/15 1613  BP: 178/105 212/115  Pulse: 70 62  Temp: 35.6 C 97.32F    Complications: No apparent anesthesia complications

## 2015-03-29 ENCOUNTER — Encounter: Payer: Self-pay | Admitting: Orthopedic Surgery

## 2015-04-01 NOTE — Anesthesia Postprocedure Evaluation (Signed)
Anesthesia Post Note  Patient: Amanda Stein  Procedure(s) Performed: Procedure(s) (LRB): OPEN REDUCTION INTERNAL FIXATION (ORIF) ULNAR FRACTURE (Left)  Patient location during evaluation: PACU Anesthesia Type: General Level of consciousness: awake, awake and alert and oriented Pain management: pain level controlled Vital Signs Assessment: post-procedure vital signs reviewed and stable Respiratory status: spontaneous breathing Cardiovascular status: blood pressure returned to baseline Anesthetic complications: no    Last Vitals:  Filed Vitals:   03/28/15 1732 03/28/15 1758  BP: 147/78 161/81  Pulse: 79 89  Temp: 36.4 C   Resp: 16 16    Last Pain:  Filed Vitals:   03/29/15 0913  PainSc: 4                  Nyemah Watton

## 2016-05-19 IMAGING — CR DG WRIST 2V*L*
1 series · 2 of 2 positions shown · non-contrast
Comparison: 03/21/2015

CLINICAL DATA: Postop left wrist

EXAM:
LEFT WRIST - 2 VIEW

[Series 1: pa · 0.17mm/px · 2 of 2 slices shown]
[im 1/2]
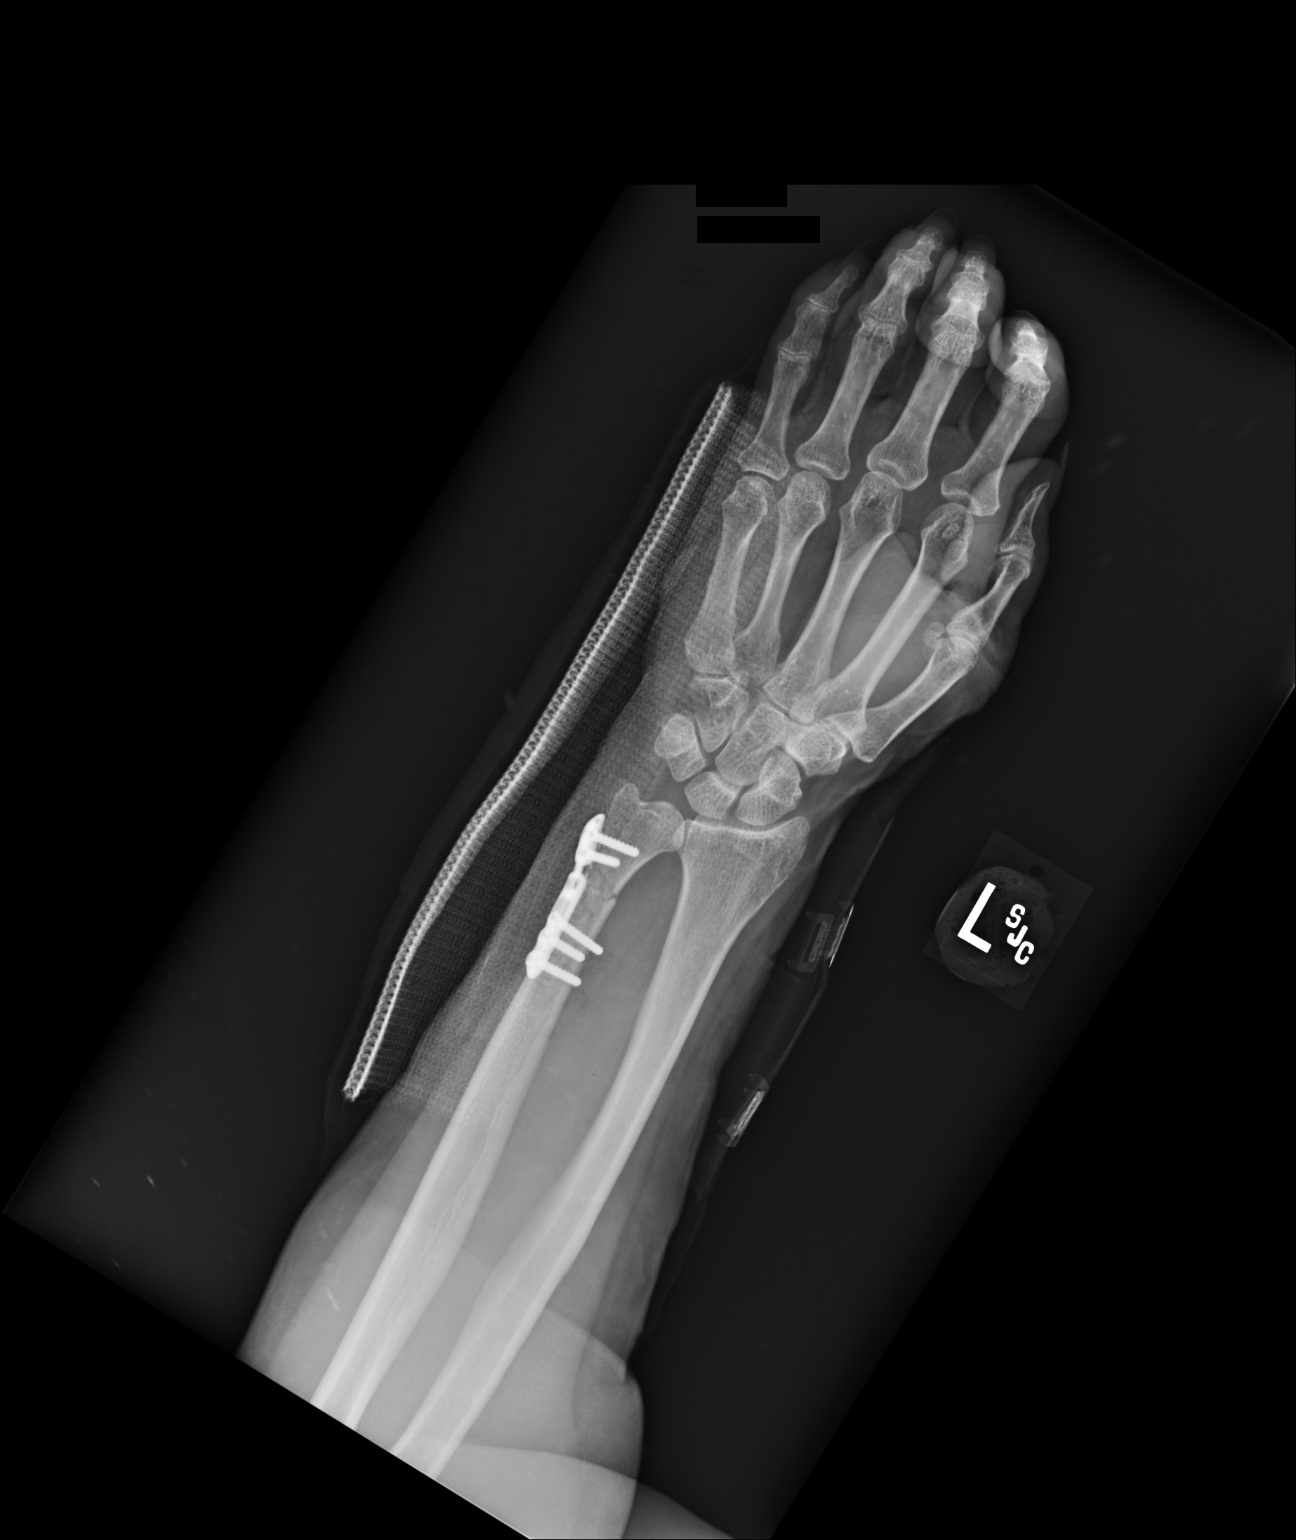
[im 2/2]
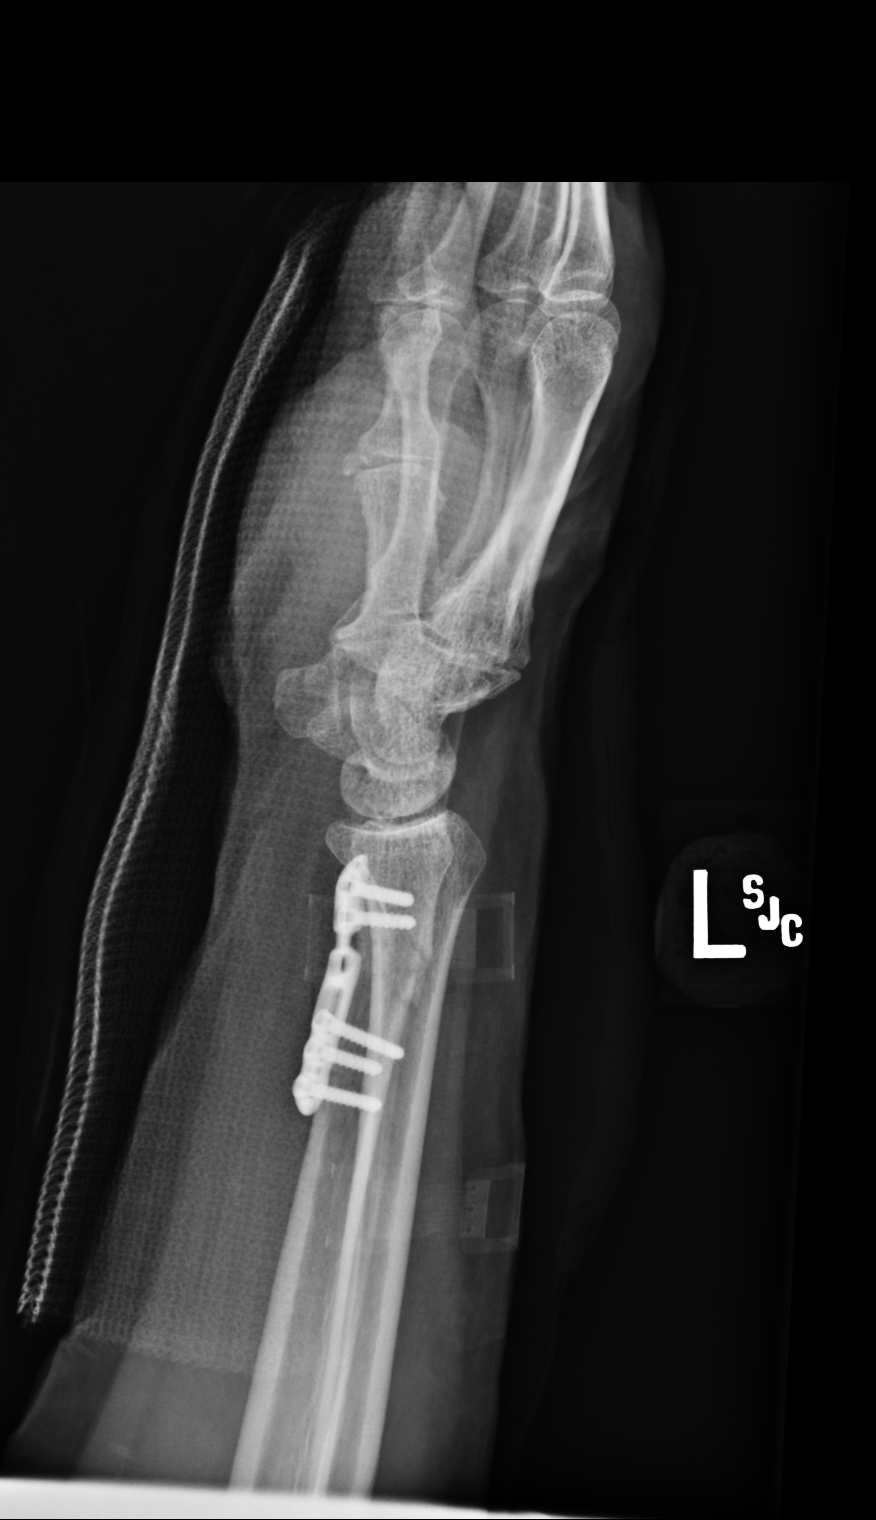

[2 of 2 positions shown; findings below may reference images not displayed]

FINDINGS: Open reduction and internal fixation of a distal ulnar shaft
fracture. Alignment is anatomic and the hardware is intact. No new
osseous findings.
IMPRESSION: No adverse finding after ulna fracture ORIF.

## 2019-06-02 ENCOUNTER — Ambulatory Visit: Payer: Self-pay | Attending: Internal Medicine

## 2019-06-02 DIAGNOSIS — Z23 Encounter for immunization: Secondary | ICD-10-CM

## 2019-06-02 NOTE — Progress Notes (Signed)
   Covid-19 Vaccination Clinic  Name:  Amanda Stein    MRN: [id number] DOB: [birth year]  06/02/2019  Amanda Stein was observed post Covid-19 immunization for 15 minutes without incident. She was provided with Vaccine Information Sheet and instruction to access the V-Safe system.   Amanda Stein was instructed to call 911 with any severe reactions post vaccine: Marland Kitchen Difficulty breathing  . Swelling of face and throat  . A fast heartbeat  . A bad rash all over body  . Dizziness and weakness   Immunizations Administered    Name Date Dose VIS Date Route   Moderna COVID-19 Vaccine 06/02/2019 10:16 AM 0.5 mL 02/21/2019 Intramuscular   Manufacturer: Moderna   Lot: [id number]   NDC: 53794-327-61

## 2019-07-04 ENCOUNTER — Ambulatory Visit: Payer: Self-pay | Attending: Internal Medicine

## 2019-07-04 DIAGNOSIS — Z23 Encounter for immunization: Secondary | ICD-10-CM

## 2019-07-04 NOTE — Progress Notes (Signed)
   Covid-19 Vaccination Clinic  Name:  Amanda Stein    MRN: [id number] DOB: May 16, 1960  07/04/2019  [name] was observed post Covid-19 immunization for 15 minutes without incident. She was provided with Vaccine Information Sheet and instruction to access the V-Safe system.   [name] was instructed to call 911 with any severe reactions post vaccine: Marland Kitchen Difficulty breathing  . Swelling of face and throat  . A fast heartbeat  . A bad rash all over body  . Dizziness and weakness   Immunizations Administered    Name Date Dose VIS Date Route   Moderna COVID-19 Vaccine 07/04/2019 10:03 AM 0.5 mL 02/21/2019 Intramuscular   Manufacturer: Moderna   Lot: [id number]   NDC: 21224-825-00
